# Patient Record
Sex: Male | Born: 1990 | Race: Black or African American | Hispanic: No | Marital: Single | Smoking: Former smoker
Health system: Southern US, Community
[De-identification: ages and names within clinical notes are randomized; demographics above are authoritative.]

---
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
## 2008-12-19 IMAGING — CR DG CLAVICLE*R*
1 series · 2 of 2 positions shown · non-contrast
Comparison: none

REASON FOR EXAM: injury football [REDACTED]
COMMENTS:   LMP: (Male)

PROCEDURE:     DXR - DXR CLAVICLE RIGHT  - November 12, 2006  [DATE]
RESULT:     No fracture or other significant osseous abnormality is
identified.

[Series 1: view not recorded · 0.17mm/px · 2 of 2 slices shown]
[im 1/2]
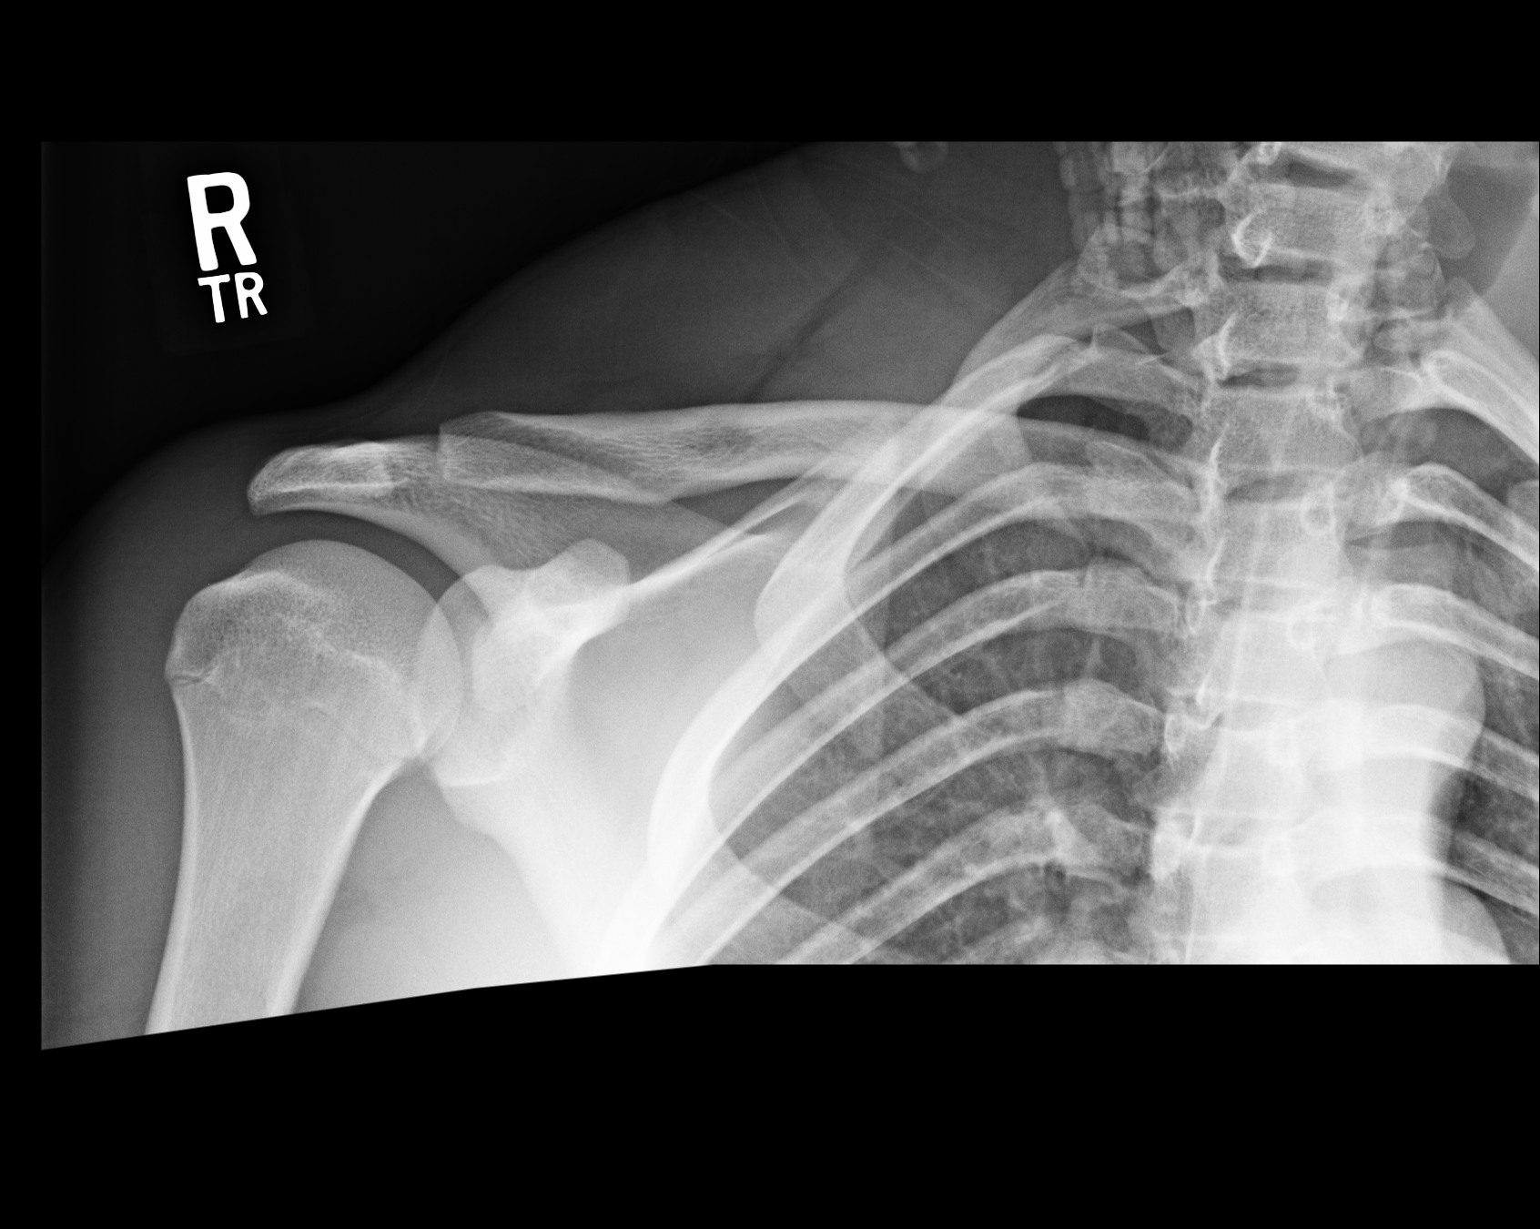
[im 2/2]
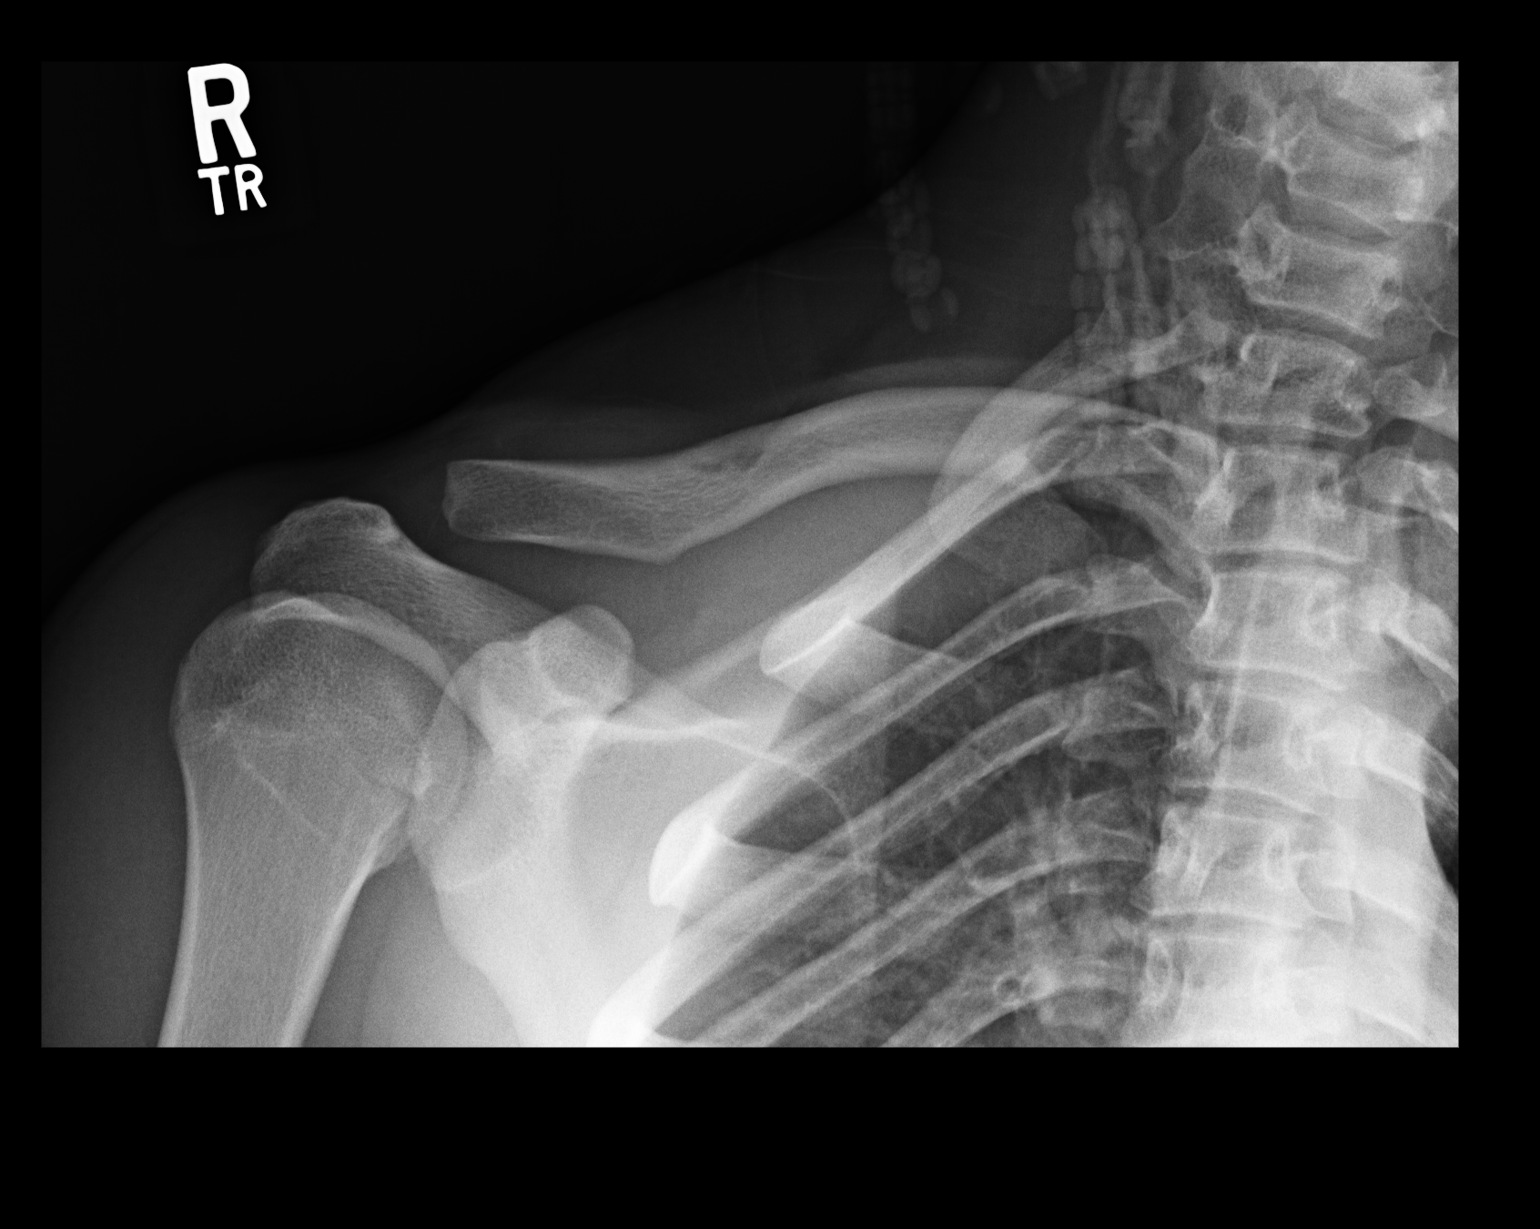

[2 of 2 positions shown; findings below may reference images not displayed]

IMPRESSION: 1.     No significant abnormalities are noted.

## 2009-06-12 ENCOUNTER — Emergency Department: Payer: Self-pay | Admitting: Emergency Medicine

## 2012-07-08 ENCOUNTER — Ambulatory Visit: Payer: No Typology Code available for payment source | Attending: Family Medicine | Admitting: Family Medicine

## 2012-07-08 DIAGNOSIS — Z23 Encounter for immunization: Secondary | ICD-10-CM | POA: Insufficient documentation

## 2012-07-08 DIAGNOSIS — S81809A Unspecified open wound, unspecified lower leg, initial encounter: Secondary | ICD-10-CM | POA: Insufficient documentation

## 2012-07-08 DIAGNOSIS — S91009A Unspecified open wound, unspecified ankle, initial encounter: Secondary | ICD-10-CM | POA: Insufficient documentation

## 2012-07-08 NOTE — Progress Notes (Signed)
Cc"  R knee laceration    Hx:  Scott Harrell is a 22 yo M on the [facility] who hit his knee on the sled today and had a stellate laceration just superior to his patella.  He reports it bled a lot when it happened.  He thinks he hit a rounded rusted button on the sled.  Is not certain of last tetanus shot.    Exam:  Gen- NAD  R knee- normal extensor mechanism and full strength to resisted knee extension without pain  Wound has a small about 1 cm stellate laceration about 1" superior to the patella. Some subQ fat exposed but otherwise superficial. No quad tendon exposed.     Procedure:    Surrounding skin cleaned with Chlorhexadine.  2 cc 1% Lidocaine with epi injected to wound edges for anesthesia.  Wound irrigated with 250 cc normal saline.  3 interrupted sutures with 3.0 monofilament placed plus one 4.0 Ethilon suture.   He tolerated the procedure well.  abx oint and sterile dressing placed.    - He should be limited for a couple days so he doesn't pull the wound open again, and then assessed as he does more.    -Tdap vaccine today also.    - 48 hours of Keflex 500 mg, one tab 4 times per day, total #8, to be cautious.   - wound re-check tomorrow      Caren Griffins, MD

## 2012-08-17 ENCOUNTER — Emergency Department: Payer: Self-pay | Admitting: Internal Medicine

## 2013-06-24 ENCOUNTER — Ambulatory Visit: Payer: No Typology Code available for payment source | Attending: Family Medicine | Admitting: Family Medicine

## 2013-06-24 ENCOUNTER — Encounter (HOSPITAL_BASED_OUTPATIENT_CLINIC_OR_DEPARTMENT_OTHER): Payer: Self-pay | Admitting: Family Medicine

## 2013-06-24 ENCOUNTER — Other Ambulatory Visit (HOSPITAL_BASED_OUTPATIENT_CLINIC_OR_DEPARTMENT_OTHER): Payer: Self-pay

## 2013-06-24 VITALS — BP 132/81 | HR 53 | Temp 98.0°F | Ht 75.0 in | Wt 315.0 lb

## 2013-06-24 DIAGNOSIS — R310 Gross hematuria: Secondary | ICD-10-CM | POA: Insufficient documentation

## 2013-06-24 LAB — URINALYSIS COMPLETE, URN
Bacteria, URN: NONE SEEN
Bilirubin (Qual), URN: NEGATIVE
Epith Cells_Renal/Trans,URN: NEGATIVE /HPF
Epith Cells_Squamous, URN: NEGATIVE /LPF
Ketones, URN: NEGATIVE mg/dL
Nitrite, URN: NEGATIVE
Specific Gravity, URN: 1.009 g/mL (ref 1.002–1.027)
pH, URN: 7 (ref 5.0–8.0)

## 2013-06-24 LAB — COMPREHENSIVE METABOLIC PANEL
ALT (GPT): 40 U/L (ref 10–64)
AST (GOT): 43 U/L — ABNORMAL HIGH (ref 9–38)
Albumin: 4.5 g/dL (ref 3.5–5.2)
Alkaline Phosphatase (Total): 50 U/L (ref 42–136)
Anion Gap: 6 (ref 4–12)
Bilirubin (Total): 0.5 mg/dL (ref 0.2–1.3)
Calcium: 9.3 mg/dL (ref 8.9–10.2)
Carbon Dioxide, Total: 30 mEq/L (ref 22–32)
Chloride: 101 mEq/L (ref 98–108)
Creatinine: 1.31 mg/dL — ABNORMAL HIGH (ref 0.51–1.18)
GFR, Calc, African American: >60 mL/min (ref >59)
GFR, Calc, European American: >60 mL/min (ref >59)
Glucose: 82 mg/dL (ref 62–125)
Potassium: 4.1 mEq/L (ref 3.6–5.2)
Protein (Total): 7.3 g/dL (ref 6.0–8.2)
Sodium: 137 mEq/L (ref 135–145)
Urea Nitrogen: 17 mg/dL (ref 8–21)

## 2013-06-24 LAB — CBC, DIFF
% Basophils: 1 %
% Eosinophils: 2 %
% Immature Granulocytes: 0 %
% Lymphocytes: 26 %
% Monocytes: 7 %
% Neutrophils: 64 %
Absolute Eosinophil Count: 0.1 10*3/uL (ref 0.00–0.50)
Absolute Lymphocyte Count: 1.41 10*3/uL (ref 1.00–4.80)
Basophils: 0.03 10*3/uL (ref 0.00–0.20)
Hematocrit: 46 % (ref 38–50)
Hemoglobin: 15.6 g/dL (ref 13.0–18.0)
Immature Granulocytes: 0.01 10*3/uL (ref 0.00–0.05)
MCH: 27.3 pg (ref 27.3–33.6)
MCHC: 33.8 g/dL (ref 32.2–36.5)
MCV: 81 fL (ref 81–98)
Monocytes: 0.38 10*3/uL (ref 0.00–0.80)
Neutrophils: 3.5 10*3/uL (ref 1.80–7.00)
Platelet Count: 233 10*3/uL (ref 150–400)
RBC: 5.71 mil/uL — ABNORMAL HIGH (ref 4.40–5.60)
RDW-CV: 12.8 % (ref 11.6–14.4)
WBC: 5.43 10*3/uL (ref 4.3–10.0)

## 2013-06-24 LAB — 1ST EXTRA GOLD TOP

## 2013-06-24 LAB — MYOGLOBIN: Myoglobin: 85 ng/mL (ref 17–106)

## 2013-06-24 LAB — CK, CREATINE KINASE, TOTAL ACTIVITY: Creatine Kinase Total Activity: 919 U/L — ABNORMAL HIGH (ref 62–325)

## 2013-06-24 LAB — MYOGLOBIN, URINE: Myoglobin, Urine: 1 mcg/L (ref 0–52)

## 2013-06-24 NOTE — Progress Notes (Signed)
Cc: hematuria     HPI:  Scott Harrell is a 23 year old male on the [place] who presents for evaluation of gross hematuria  Started yesterday after his workout: standard 30 min on the field workout; urine was a little red last night  Drank a lot of water  Not much hematuria this morning but then increased  Feels fine now; no complaints  Maybe a little soreness on his lower abs like from a workout; no suprapubic pain; no flank pain  No dysuria or frequency  No discharge  No F/C/S, no recent illness  Had a cold 3 wks ago  No N/V/D or abd pain  No h/o kidney stones    No Rx meds  Taking amino acids (has been on that for awhile); nothing new    Exam:  BP 132/81   Pulse 53   Temp(Src) 98 F (36.7 C)   Ht 6\' 3"  (1.905 m)   Wt 315 lb (142.883 kg)   BMI 39.37 kg/m2     SpO2 97%   Gen - WD/WN, NAD  HEENT - PERRL, EOMI, sclear anicteric, conj pink, OP clear  Neck - no LAD, no TM or nodules  CV - RRR no murmurs  Lung - CTAB  Abd - s/ND/NT, no HSM, no masses  Back- no CVAT  Ext - no edema, good distal pulses  Skin - no rashes      A/P:  (599.71) Hematuria, gross  (primary encounter diagnosis)  Plan: Tygh  COMPLETE URINALYSIS, URINE C&S, MYOGLOBIN,        URINE, CBC, DIFF, COMPREHENSIVE METABOLIC         PANEL, CREATINE KINASE TOTAL ACTIVITY,         MYOGLOBIN  - clinically looks good; will get labs as above; results and clinical course will direct further workup; may need CT tomorrow and urology consultation  - will follow closely      Nolon Nations, MD

## 2013-06-24 NOTE — Progress Notes (Signed)
Addendum:  Labs noted --  Blood, protein, glu in urine.  Cr 1.3.    Scott Harrell developed R sided pain this evening. Went to [facility] ER.  CT with contrast suggestive of R renal mass.  Making arrangements for transfer to [facility] urology service.    Nolon Nations, MD

## 2013-06-25 ENCOUNTER — Other Ambulatory Visit (HOSPITAL_COMMUNITY): Payer: Self-pay | Admitting: Surgery

## 2013-06-25 ENCOUNTER — Observation Stay
Admission: RE | Admit: 2013-06-25 | Discharge: 2013-06-25 | Disposition: A | Discharge status: Home / Self Care | Payer: PRIVATE HEALTH INSURANCE | Source: Other Acute Inpatient Hospital | Attending: Urology | Admitting: Urology

## 2013-06-25 ENCOUNTER — Observation Stay (HOSPITAL_BASED_OUTPATIENT_CLINIC_OR_DEPARTMENT_OTHER): Payer: PRIVATE HEALTH INSURANCE | Admitting: Urology

## 2013-06-25 ENCOUNTER — Other Ambulatory Visit (HOSPITAL_BASED_OUTPATIENT_CLINIC_OR_DEPARTMENT_OTHER): Payer: Self-pay | Admitting: Urology

## 2013-06-25 DIAGNOSIS — R1031 Right lower quadrant pain: Secondary | ICD-10-CM

## 2013-06-25 DIAGNOSIS — N289 Disorder of kidney and ureter, unspecified: Secondary | ICD-10-CM | POA: Insufficient documentation

## 2013-06-25 DIAGNOSIS — R310 Gross hematuria: Secondary | ICD-10-CM

## 2013-06-25 LAB — BASIC METABOLIC PANEL
Anion Gap: 4 (ref 4–12)
Calcium: 8.6 mg/dL — ABNORMAL LOW (ref 8.9–10.2)
Carbon Dioxide, Total: 28 mEq/L (ref 22–32)
Chloride: 107 mEq/L (ref 98–108)
Creatinine: 1.22 mg/dL — ABNORMAL HIGH (ref 0.51–1.18)
GFR, Calc, African American: >60 mL/min (ref >59)
GFR, Calc, European American: >60 mL/min (ref >59)
Glucose: 103 mg/dL (ref 62–125)
Potassium: 3.9 mEq/L (ref 3.6–5.2)
Sodium: 139 mEq/L (ref 135–145)
Urea Nitrogen: 17 mg/dL (ref 8–21)

## 2013-06-26 LAB — URINE C/S
Colony Count: <100
Culture: NO GROWTH

## 2013-06-26 LAB — R/O MRSA

## 2013-06-27 ENCOUNTER — Other Ambulatory Visit (HOSPITAL_BASED_OUTPATIENT_CLINIC_OR_DEPARTMENT_OTHER): Payer: Self-pay | Admitting: Urology

## 2013-06-27 DIAGNOSIS — N2889 Other specified disorders of kidney and ureter: Secondary | ICD-10-CM

## 2013-06-30 ENCOUNTER — Encounter (HOSPITAL_BASED_OUTPATIENT_CLINIC_OR_DEPARTMENT_OTHER): Payer: Self-pay | Admitting: Urology

## 2013-06-30 ENCOUNTER — Ambulatory Visit: Payer: PRIVATE HEALTH INSURANCE | Attending: Urology | Admitting: Urology

## 2013-06-30 VITALS — BP 159/89 | HR 48 | Ht 75.0 in | Wt 323.3 lb

## 2013-06-30 DIAGNOSIS — N2889 Other specified disorders of kidney and ureter: Secondary | ICD-10-CM

## 2013-06-30 DIAGNOSIS — N289 Disorder of kidney and ureter, unspecified: Secondary | ICD-10-CM | POA: Insufficient documentation

## 2013-06-30 LAB — PROTHROMBIN & PTT
Partial Thromboplastin Time: 30 s (ref 22–35)
Prothrombin INR: 1 (ref 0.8–1.3)
Prothrombin Time Patient: 13.1 s (ref 10.7–15.6)

## 2013-07-01 ENCOUNTER — Other Ambulatory Visit (HOSPITAL_BASED_OUTPATIENT_CLINIC_OR_DEPARTMENT_OTHER): Payer: Self-pay | Admitting: Urology

## 2013-07-01 ENCOUNTER — Ambulatory Visit
Admission: RE | Admit: 2013-07-01 | Discharge: 2013-07-01 | Disposition: A | Discharge status: Home / Self Care | Payer: PRIVATE HEALTH INSURANCE | Attending: Urology | Admitting: Urology

## 2013-07-01 ENCOUNTER — Ambulatory Visit (HOSPITAL_COMMUNITY): Payer: PRIVATE HEALTH INSURANCE | Admitting: Urology

## 2013-07-01 ENCOUNTER — Ambulatory Visit (HOSPITAL_BASED_OUTPATIENT_CLINIC_OR_DEPARTMENT_OTHER): Payer: PRIVATE HEALTH INSURANCE | Admitting: Radiology Med

## 2013-07-01 ENCOUNTER — Ambulatory Visit (HOSPITAL_BASED_OUTPATIENT_CLINIC_OR_DEPARTMENT_OTHER): Payer: PRIVATE HEALTH INSURANCE

## 2013-07-01 DIAGNOSIS — N289 Disorder of kidney and ureter, unspecified: Secondary | ICD-10-CM | POA: Insufficient documentation

## 2013-07-01 DIAGNOSIS — C649 Malignant neoplasm of unspecified kidney, except renal pelvis: Secondary | ICD-10-CM

## 2013-07-01 DIAGNOSIS — C641 Malignant neoplasm of right kidney, except renal pelvis: Secondary | ICD-10-CM

## 2013-07-01 NOTE — Progress Notes (Signed)
UROLOGY OUTPATIENT CONSULTATION:     CHIEF COMPLAINT  Right renal mass.    HISTORY OF PRESENT ILLNESS  Scott Harrell is a 23 year old male for evaluation and management of a newly identified right renal mass. This mass was found upon presentation with painless gross hematuria. Scott Harrell notes associated symptoms of hematuria and flank pain associated with passage of clots. Scott Harrell has no history of tobacco use disorder, no family history of kidney cancer or renal tumors, and no history of obesity. Scott Harrell does not have hypertension.    Past Medical Hx:    None    Past Surgical Hx:    Left and right repairs of lateral meniscus tears.    Family Hx:    No family history of genitourinary illnesses or malignancies.    Social Hx:   reports that Scott Harrell has never smoked. Scott Harrell has never used smokeless tobacco.    Active Meds:    Outpatient Prescriptions Marked as Taking for the 06/30/13 encounter (Office Visit) with Scott Harrell   Medication Sig Dispense Refill   . Ondansetron HCl (ZOFRAN OR)        . OXYCODONE HCL ER OR            Allergies:    Allergies as of 06/30/2013   . (No Known Allergies)       Review of Systems:   A 14-system review of systems was filled out by the patient and reviewed by me and can be linked to in the chart dated today.    OBJECTIVE:  Physical Exam  Vitals:  BP 159/89   Pulse 48   Ht 6\' 3"  (1.905 m)   Wt 323 lb 4.8 oz (146.648 kg)   BMI 40.41 kg/m2     General: Scott Harrell appears comfortable and in no acute distress.  HEENT:  WNL  Lymphatic:  No neck adenopathy, no groin adenopathy, no axilla adenopathy.  Respiratory:  Normal effort, bilateral symmetric chest expansion, no wheezes.  Cardiovascular:  Palpable pulses in the upper extremities that are regular.  Abdomen: No abdominal masses or tenderness, no hepatosplenomegaly  Back: , No CVA tenderness and no flank mass.  Extremities:  No clubbing or cyanosis  Neuro/Psych:  Alert and Oriented x3, affect  appropriate.    Labs Reviewed today with the patient include:  Results for orders placed in visit on 06/30/13   PROTHROMBIN & PTT       Result Value Ref Range    Prothrombin Time Patient 13.1  10.7 - 15.6 s    Prothrombin INR 1.0  0.8 - 1.3    PTT Patient 30  22 - 35 s    PTT X_Mean        Value: To calculate the PTT X Mean divide PTT value by 29.     IMAGING:  I personally reviewed his imaging including his CT and MRI. These demonstrate a normal left kidney with no masses, hydronephrosis, or stones. The right kidney is largely normal with a centrally located 3.2 cm mass. On CT, the mass is slightly hyperdense on precontrast images. On MRI, there is mild enhancement on the nephrographic phase but definite loss of contrast on the delayed phase. The mass overtly abuts or even invades the collecting system and has caused retraction of the renal capsule overlying the tumor. There are some sub-centimeter lymph nodes in the right renal hilum and right paracaval area.    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 The differential includes collecting duct carcinoma and renal medullary carcinoma in addition to [place].     PLAN:  1. I had a long discussion with Scott Harrell regarding the nature of renal tumors including the likelihood that this represents a renal cell carcinoma. We discussed the staging of kidney cancer and our impression that-if this is a cancer-this renal mass would be a clinical stage T1a cancer. We discussed options for management of this renal mass including active surveillance and intervention with either ablation or extirpation. We also discussed the option of renal mass biopsy and the rationale for proceeding with renal mass biopsy given our concerns for more aggressive histologies.  2. We discussed that, for surgery for a renal mass the first  consideration is whether or not the renal mass is amenable to nephron-sparing surgery. We discussed that the renal mass we are reviewing is potentiall amenable to nephron-sparing surgery such as with partial nephrectomy for the following reasons: size, his young age. We discussed that this surgery could be accomplished through minimally-invasive techniques such as with robot-assisted or laparoscopic techniques. We reviewed the surgical and technical risks as well as the medical and anesthetic risks. We completed the preoperative packet and informed consent document for a planned robotic renal surgery with the decision for partial versus radical nephrectomy pending biopsy results.   3. We discussed the risks, benefits, and alternatives to proceeding with renal mass biopsy. These include the risks of bleeding, infection, and non-diagnosis. We have submitted an order for renal mass biopsy today and this is scheduled for tomorrow. We will contact Scott Harrell to review the biopsy results.    Thank you very much for referring Scott Harrell to our clinic at the [facility].

## 2013-07-03 LAB — PATHOLOGY, SURGICAL

## 2013-07-09 ENCOUNTER — Other Ambulatory Visit (HOSPITAL_BASED_OUTPATIENT_CLINIC_OR_DEPARTMENT_OTHER): Payer: PRIVATE HEALTH INSURANCE | Admitting: Urology

## 2013-07-09 DIAGNOSIS — C649 Malignant neoplasm of unspecified kidney, except renal pelvis: Secondary | ICD-10-CM

## 2013-07-15 ENCOUNTER — Other Ambulatory Visit (HOSPITAL_BASED_OUTPATIENT_CLINIC_OR_DEPARTMENT_OTHER): Payer: Self-pay | Admitting: Radiology Med

## 2013-07-15 ENCOUNTER — Other Ambulatory Visit (HOSPITAL_BASED_OUTPATIENT_CLINIC_OR_DEPARTMENT_OTHER): Payer: Self-pay

## 2013-07-15 ENCOUNTER — Ambulatory Visit
Admit: 2013-07-15 | Discharge: 2013-07-15 | Disposition: A | Discharge status: Home / Self Care | Payer: PRIVATE HEALTH INSURANCE | Attending: Urology | Admitting: Urology

## 2013-07-15 DIAGNOSIS — N289 Disorder of kidney and ureter, unspecified: Secondary | ICD-10-CM | POA: Insufficient documentation

## 2013-07-15 DIAGNOSIS — C649 Malignant neoplasm of unspecified kidney, except renal pelvis: Secondary | ICD-10-CM | POA: Insufficient documentation

## 2013-07-15 LAB — CBC (HEMOGRAM)
Hematocrit: 45 % (ref 38–50)
Hemoglobin: 15.1 g/dL (ref 13.0–18.0)
MCH: 26.8 pg — ABNORMAL LOW (ref 27.3–33.6)
MCHC: 33.8 g/dL (ref 32.2–36.5)
MCV: 79 fL — ABNORMAL LOW (ref 81–98)
Platelet Count: 200 10*3/uL (ref 150–400)
RBC: 5.64 mil/uL — ABNORMAL HIGH (ref 4.40–5.60)
RDW-CV: 12.4 % (ref 11.6–14.4)
WBC: 5.46 10*3/uL (ref 4.3–10.0)

## 2013-07-15 LAB — PROTHROMBIN & PTT
Partial Thromboplastin Time: 30 s (ref 22–35)
Prothrombin INR: 1 (ref 0.8–1.3)
Prothrombin Time Patient: 13 s (ref 10.7–15.6)

## 2013-07-16 LAB — PSBC PREADMIT TYPE AND SCREEN: Indir. Antiglobulin Rslt (Sendout): NEGATIVE

## 2013-07-16 LAB — ABO AND RH CONFIRMATION (SENDOUT)

## 2013-07-17 ENCOUNTER — Inpatient Hospital Stay (HOSPITAL_COMMUNITY): Payer: PRIVATE HEALTH INSURANCE | Admitting: Urology

## 2013-07-17 ENCOUNTER — Other Ambulatory Visit (HOSPITAL_COMMUNITY): Payer: Self-pay | Admitting: Urology

## 2013-07-17 ENCOUNTER — Inpatient Hospital Stay
Admission: RE | Admit: 2013-07-17 | Discharge: 2013-07-18 | DRG: 657 | Disposition: A | Discharge status: Home / Self Care | Payer: PRIVATE HEALTH INSURANCE | Attending: Urology | Admitting: Urology

## 2013-07-17 ENCOUNTER — Other Ambulatory Visit (HOSPITAL_BASED_OUTPATIENT_CLINIC_OR_DEPARTMENT_OTHER): Payer: Self-pay | Admitting: Urology

## 2013-07-17 DIAGNOSIS — C649 Malignant neoplasm of unspecified kidney, except renal pelvis: Secondary | ICD-10-CM

## 2013-07-17 DIAGNOSIS — C772 Secondary and unspecified malignant neoplasm of intra-abdominal lymph nodes: Secondary | ICD-10-CM

## 2013-07-17 DIAGNOSIS — Z6841 Body Mass Index (BMI) 40.0 and over, adult: Secondary | ICD-10-CM

## 2013-07-17 LAB — HEMATOCRIT: Hematocrit: 43 % (ref 38–50)

## 2013-07-18 ENCOUNTER — Other Ambulatory Visit (HOSPITAL_COMMUNITY): Payer: Self-pay | Admitting: Urology

## 2013-07-18 DIAGNOSIS — Z483 Aftercare following surgery for neoplasm: Secondary | ICD-10-CM

## 2013-07-18 DIAGNOSIS — Z48816 Encounter for surgical aftercare following surgery on the genitourinary system: Secondary | ICD-10-CM

## 2013-07-18 LAB — BASIC METABOLIC PANEL
Anion Gap: 8 (ref 4–12)
Calcium: 8.7 mg/dL — ABNORMAL LOW (ref 8.9–10.2)
Carbon Dioxide, Total: 25 mEq/L (ref 22–32)
Chloride: 102 mEq/L (ref 98–108)
Creatinine: 1.88 mg/dL — ABNORMAL HIGH (ref 0.51–1.18)
GFR, Calc, African American: 54 mL/min — ABNORMAL LOW (ref >59)
GFR, Calc, European American: 44 mL/min — ABNORMAL LOW (ref >59)
Glucose: 114 mg/dL (ref 62–125)
Potassium: 3.8 mEq/L (ref 3.6–5.2)
Sodium: 135 mEq/L (ref 135–145)
Urea Nitrogen: 18 mg/dL (ref 8–21)

## 2013-07-18 LAB — HEMATOCRIT: Hematocrit: 42 % (ref 38–50)

## 2013-07-21 ENCOUNTER — Telehealth (HOSPITAL_BASED_OUTPATIENT_CLINIC_OR_DEPARTMENT_OTHER): Payer: Self-pay

## 2013-07-21 NOTE — Telephone Encounter (Signed)
Patient hasn't had  BM since Thursday last week (5 days ago).  Patient is taking oxycodone 2 tabs every 6 hour.  Denies nausea.  Passing flatus.  I consulted with Dr. Simeon Craft who recommended docusate BID, senna and add dulcolax tomorrow if still no BM.  Wean oxycodone as able.  Patient's dad was satisfied with the plan, will discuss with patient.

## 2013-07-23 LAB — PATHOLOGY, SURGICAL

## 2013-07-24 ENCOUNTER — Telehealth (HOSPITAL_BASED_OUTPATIENT_CLINIC_OR_DEPARTMENT_OTHER): Payer: Self-pay | Admitting: Family Medicine

## 2013-07-24 DIAGNOSIS — G8918 Other acute postprocedural pain: Secondary | ICD-10-CM

## 2013-07-24 DIAGNOSIS — R110 Nausea: Secondary | ICD-10-CM

## 2013-07-24 MED ORDER — HYDROCODONE-ACETAMINOPHEN 5-325 MG OR TABS
1.0000 | ORAL_TABLET | Freq: Four times a day (QID) | ORAL | Status: AC | PRN
Start: 2013-07-24 — End: ?

## 2013-07-24 MED ORDER — ONDANSETRON 4 MG OR TBDP
ORAL_TABLET | ORAL | Status: AC
Start: 2013-07-24 — End: ?

## 2013-07-24 NOTE — Telephone Encounter (Signed)
Post-op oxycodone is making him nauseous and requiring a lot of Zofran.    Will switch to Vicodin and refill Zofran.    Nolon Nations, MD

## 2013-07-31 ENCOUNTER — Encounter (HOSPITAL_BASED_OUTPATIENT_CLINIC_OR_DEPARTMENT_OTHER): Payer: Self-pay | Admitting: Urology

## 2013-07-31 ENCOUNTER — Ambulatory Visit: Payer: PRIVATE HEALTH INSURANCE | Attending: Urology | Admitting: Urology

## 2013-07-31 VITALS — BP 156/100 | HR 80 | Ht 75.0 in | Wt 329.7 lb

## 2013-07-31 DIAGNOSIS — C649 Malignant neoplasm of unspecified kidney, except renal pelvis: Secondary | ICD-10-CM | POA: Insufficient documentation

## 2013-07-31 NOTE — Progress Notes (Signed)
UROLOGY CLINIC NOTE      ID/CC:    Postoperative care following right robotic nephrectomy.    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 Scott Harrell has recovered well from surgery and reports good appetite, no constipation, and no pain requiring narcotic pain medications. Scott Harrell has had one of his port site incisions open up and is packing it, but otherwise has no concerns about the incisions including no redness, no swelling, and no discharge. They report that their energy level is improving. He is eager to resume strenuous activity.    Active Meds:    Outpatient Prescriptions Marked as Taking for the 07/31/13 encounter (Office Visit) with Scott Harrell   Medication Sig Dispense Refill   . Ondansetron 4 MG Oral TABLET DISPERSIBLE 1-2 tabs sublingual every 8 hours as needed 30 tablet 1   . Ondansetron HCl (ZOFRAN OR)          Allergies:    Allergies as of 07/31/2013   . (No Known Allergies)       OBJECTIVE:  Vital Signs:  BP 156/100  Pulse 80  Ht 6\' 3"  (1.905 m)  Wt 329 lb 11.2 oz (149.551 kg)  BMI 41.21 kg/m2  General: Scott Harrell appears comfortable and in no acute distress.  Abdomen: Soft, non-tender, non-distended, the incisions are clean, dry, and intact with the exception of his RLQ 8 mm port site that opened up. There is good granulation tissue at the base with no discharge and no adjacent cellulitis.    Review of Medical Images, tracings or specimens:    Labs Reviewed today with the patient include  Orders Only on 07/18/2013   Component Date Value Ref Range Status   . Sodium 07/18/2013 135  135 - 145 mEq/L Final   . Potassium 07/18/2013 3.8  3.6 - 5.2 mEq/L Final   . Chloride 07/18/2013 102  98 - 108 mEq/L Final   . Carbon Dioxide, Total 07/18/2013 25  22 - 32 mEq/L Final   . Anion Gap 07/18/2013 8  4 - 12  Final   . Glucose 07/18/2013 114  62 - 125 mg/dL Final   . Urea Nitrogen 07/18/2013 18  8 - 21 mg/dL Final   . Creatinine 07/18/2013 1.88* 0.51 - 1.18 mg/dL Final   . Calcium 07/18/2013 8.7* 8.9 - 10.2 mg/dL Final   . Gfr, Calc, European American 07/18/2013 44* >59 mL/min Final   . GFR, Calc, African American 07/18/2013 54* >59 mL/min Final   . GFR, Information 07/18/2013 Calculated GFR in mL/min/1.73 m2 by MDRD equation.  Inaccurate with changing renal function.  See http://depts.[email or web address]   Final       Pathology  I reviewed the pathology with Scott Harrell which demonstrated a 3.2 cm papillary type 2 RCC with positive hilar node.    Assessment  Scott Harrell is a 23 year old male doing well status-post right robotic nephrectomy.    Plan  I will see Scott Harrell back in 3 weeks for wound check. With respect to kidney cancer surveillance, we will perform his first CT in 3 months including chest, abdomen, and pelvis.

## 2013-08-21 ENCOUNTER — Encounter (HOSPITAL_BASED_OUTPATIENT_CLINIC_OR_DEPARTMENT_OTHER): Payer: PRIVATE HEALTH INSURANCE | Admitting: Urology

## 2013-08-28 ENCOUNTER — Ambulatory Visit: Payer: PRIVATE HEALTH INSURANCE | Attending: Urology | Admitting: Urology

## 2013-08-28 ENCOUNTER — Encounter (HOSPITAL_BASED_OUTPATIENT_CLINIC_OR_DEPARTMENT_OTHER): Payer: Self-pay | Admitting: Urology

## 2013-08-28 VITALS — BP 136/83 | HR 74 | Ht 75.0 in | Wt 333.0 lb

## 2013-08-28 DIAGNOSIS — C649 Malignant neoplasm of unspecified kidney, except renal pelvis: Secondary | ICD-10-CM | POA: Insufficient documentation

## 2013-08-29 NOTE — Progress Notes (Signed)
UROLOGY CLINIC NOTE      ID/CC:    Postoperative care following right robotic nephrectomy.    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 Scott Harrell has recovered well from surgery and reports good appetite, no constipation, and no pain requiring narcotic pain medications. Scott Harrell had one of his port site incisions open up and was packing it, but it has now completely healed. He is eager to resume strenuous activity.    Active Meds:    No outpatient prescriptions have been marked as taking for the 08/28/13 encounter (Office Visit) with Lauree Chandler.       Allergies:    Allergies as of 08/28/2013   . (No Known Allergies)       OBJECTIVE:  Vital Signs:  BP 136/83  Pulse 74  Ht 6\' 3"  (1.905 m)  Wt 333 lb (151.048 kg)  BMI 41.62 kg/m2  General: Scott Harrell appears comfortable and in no acute distress.  Abdomen: Soft, non-tender, non-distended, the incisions are clean, dry, and intact with excellent healing of the left arm robotic 8 mm port site that opened up.    Review of Medical Images, tracings or specimens:    Labs Reviewed today with the patient include  Orders Only on 07/18/2013   Component Date Value Ref Range Status   . Sodium 07/18/2013 135  135 - 145 mEq/L Final   . Potassium 07/18/2013 3.8  3.6 - 5.2 mEq/L Final   . Chloride 07/18/2013 102  98 - 108 mEq/L Final   . Carbon Dioxide, Total 07/18/2013 25  22 - 32 mEq/L Final   . Anion Gap 07/18/2013 8  4 - 12 Final   . Glucose 07/18/2013 114  62 - 125 mg/dL Final   . Urea Nitrogen 07/18/2013 18  8 - 21 mg/dL Final   . Creatinine 07/18/2013 1.88* 0.51 - 1.18 mg/dL Final   . Calcium 07/18/2013 8.7* 8.9 - 10.2 mg/dL Final   . Gfr, Calc, European American 07/18/2013 44* >59 mL/min Final   . GFR, Calc, African American 07/18/2013 54* >59 mL/min Final   . GFR, Information  07/18/2013 Calculated GFR in mL/min/1.73 m2 by MDRD equation.  Inaccurate with changing renal function.  See http://depts.[email or web address]   Final     Assessment  Scott Harrell is a 23 year old male doing well status-post right robotic nephrectomy.    Plan  I will see Scott Harrell back in September 2015 for surveillance CT of the chest, abdomen, and pelvis. He is going to review his schedule and let me know a good date to schedule his imaging.

## 2013-10-08 ENCOUNTER — Ambulatory Visit: Payer: PRIVATE HEALTH INSURANCE | Attending: Urology

## 2013-10-08 ENCOUNTER — Other Ambulatory Visit (HOSPITAL_BASED_OUTPATIENT_CLINIC_OR_DEPARTMENT_OTHER): Payer: PRIVATE HEALTH INSURANCE

## 2013-10-08 DIAGNOSIS — C649 Malignant neoplasm of unspecified kidney, except renal pelvis: Secondary | ICD-10-CM | POA: Insufficient documentation

## 2013-10-08 LAB — CREATININE BY I_STAT (POC), ~~LOC~~: Creatinine (POC): 2.2 mg/dL — ABNORMAL HIGH (ref 0.51–1.18)

## 2013-10-09 ENCOUNTER — Other Ambulatory Visit (HOSPITAL_BASED_OUTPATIENT_CLINIC_OR_DEPARTMENT_OTHER): Payer: PRIVATE HEALTH INSURANCE

## 2013-10-14 ENCOUNTER — Other Ambulatory Visit (INDEPENDENT_AMBULATORY_CARE_PROVIDER_SITE_OTHER): Payer: Self-pay

## 2013-10-14 ENCOUNTER — Ambulatory Visit
Admit: 2013-10-14 | Discharge: 2013-10-14 | Disposition: A | Discharge status: Home / Self Care | Payer: PRIVATE HEALTH INSURANCE | Attending: Family Medicine | Admitting: Family Medicine

## 2013-10-14 ENCOUNTER — Other Ambulatory Visit (HOSPITAL_BASED_OUTPATIENT_CLINIC_OR_DEPARTMENT_OTHER): Payer: PRIVATE HEALTH INSURANCE | Admitting: Family Medicine

## 2013-10-14 DIAGNOSIS — R5383 Other fatigue: Secondary | ICD-10-CM | POA: Insufficient documentation

## 2013-10-14 DIAGNOSIS — R5381 Other malaise: Secondary | ICD-10-CM | POA: Insufficient documentation

## 2013-10-14 DIAGNOSIS — C649 Malignant neoplasm of unspecified kidney, except renal pelvis: Secondary | ICD-10-CM | POA: Insufficient documentation

## 2013-10-14 LAB — COMPREHENSIVE METABOLIC PANEL
ALT (GPT): 29 U/L (ref 10–64)
AST (GOT): 34 U/L (ref 9–38)
Albumin: 4.6 g/dL (ref 3.5–5.2)
Alkaline Phosphatase (Total): 63 U/L (ref 42–136)
Anion Gap: 5 (ref 4–12)
Bilirubin (Total): 0.6 mg/dL (ref 0.2–1.3)
Calcium: 9.5 mg/dL (ref 8.9–10.2)
Carbon Dioxide, Total: 28 mEq/L (ref 22–32)
Chloride: 103 mEq/L (ref 98–108)
Creatinine: 2 mg/dL — ABNORMAL HIGH (ref 0.51–1.18)
GFR, Calc, African American: 50 mL/min — ABNORMAL LOW (ref >59)
GFR, Calc, European American: 41 mL/min — ABNORMAL LOW (ref >59)
Glucose: 122 mg/dL (ref 62–125)
Potassium: 4.4 mEq/L (ref 3.6–5.2)
Protein (Total): 7.2 g/dL (ref 6.0–8.2)
Sodium: 136 mEq/L (ref 135–145)
Urea Nitrogen: 25 mg/dL — ABNORMAL HIGH (ref 8–21)

## 2013-10-14 LAB — URINALYSIS COMPLETE, URN
Bacteria, URN: NONE SEEN
Bilirubin (Qual), URN: NEGATIVE
Epith Cells_Renal/Trans,URN: NEGATIVE /HPF
Glucose Qual, URN: NEGATIVE mg/dL
Ketones, URN: NEGATIVE mg/dL
Leukocyte Esterase, URN: NEGATIVE
Nitrite, URN: NEGATIVE
Occult Blood, URN: NEGATIVE
RBC, URN: NEGATIVE /HPF
Specific Gravity, URN: 1.024 g/mL (ref 1.002–1.027)
WBC, URN: NEGATIVE /HPF

## 2013-10-14 LAB — CBC, DIFF
% Basophils: 0 %
% Eosinophils: 1 %
% Immature Granulocytes: 1 %
% Lymphocytes: 17 %
% Monocytes: 6 %
% Neutrophils: 75 %
% Nucleated RBC: 0 %
Absolute Eosinophil Count: 0.05 10*3/uL (ref 0.00–0.50)
Absolute Lymphocyte Count: 1.24 10*3/uL (ref 1.00–4.80)
Basophils: 0.02 10*3/uL (ref 0.00–0.20)
Hematocrit: 44 % (ref 38–50)
Hemoglobin: 14.9 g/dL (ref 13.0–18.0)
Immature Granulocytes: 0.04 10*3/uL (ref 0.00–0.05)
MCH: 27.4 pg (ref 27.3–33.6)
MCHC: 34.1 g/dL (ref 32.2–36.5)
MCV: 81 fL (ref 81–98)
Monocytes: 0.43 10*3/uL (ref 0.00–0.80)
Neutrophils: 5.72 10*3/uL (ref 1.80–7.00)
Nucleated RBC: 0 10*3/uL
Platelet Count: 233 10*3/uL (ref 150–400)
RBC: 5.43 mil/uL (ref 4.40–5.60)
RDW-CV: 12.8 % (ref 11.6–14.4)
WBC: 7.5 10*3/uL (ref 4.3–10.0)

## 2013-10-14 LAB — SED RATE: Erythrocyte Sedimentation Rate: 7 mm/hr (ref 0–15)

## 2013-10-14 LAB — PROTEIN/CREATININE RATIO, TIMED URINE
Creatinine/Unit, Urine: 298 mg/dL
Protein (Total), Urine: 45 mg/dL
Protein/Creatinine Ratio: 0.2 — ABNORMAL HIGH (ref <0.2)

## 2013-10-14 LAB — THYROID STIMULATING HORMONE: Thyroid Stimulating Hormone: 2.766 u[IU]/mL (ref 0.400–5.000)

## 2013-10-14 LAB — CALCIUM

## 2013-10-14 LAB — CK, CREATINE KINASE, TOTAL ACTIVITY: Creatine Kinase Total Activity: 722 U/L — ABNORMAL HIGH (ref 62–325)

## 2013-10-15 ENCOUNTER — Other Ambulatory Visit (HOSPITAL_BASED_OUTPATIENT_CLINIC_OR_DEPARTMENT_OTHER): Payer: Self-pay | Admitting: Family Medicine

## 2013-10-15 DIAGNOSIS — R5381 Other malaise: Secondary | ICD-10-CM

## 2013-10-15 DIAGNOSIS — R5383 Other fatigue: Secondary | ICD-10-CM

## 2013-10-15 LAB — IRON BINDING CAPACITY (W/IRON, TRANSFERRIN & TRANSF SAT)
Iron, SRM: 57 ug/dL (ref 31–171)
Total Iron Binding Capacity: 326 ug/dL (ref 250–460)
Transferrin Saturation: 17 % (ref 15–50)
Transferrin: 233 mg/dL (ref 180–329)

## 2013-10-15 LAB — FERRITIN: Ferritin: 123 ng/mL (ref 20–230)

## 2013-10-15 NOTE — Progress Notes (Signed)
Patient here for lab draw.

## 2013-10-16 ENCOUNTER — Other Ambulatory Visit (HOSPITAL_BASED_OUTPATIENT_CLINIC_OR_DEPARTMENT_OTHER): Payer: Self-pay | Admitting: Family Medicine

## 2013-10-16 DIAGNOSIS — C649 Malignant neoplasm of unspecified kidney, except renal pelvis: Secondary | ICD-10-CM

## 2013-11-29 ENCOUNTER — Telehealth (HOSPITAL_BASED_OUTPATIENT_CLINIC_OR_DEPARTMENT_OTHER): Payer: Self-pay | Admitting: Family Medicine

## 2013-11-29 DIAGNOSIS — Z23 Encounter for immunization: Secondary | ICD-10-CM

## 2013-11-29 NOTE — Telephone Encounter (Signed)
Randale would like the pertussis vaccine.  Will request Tdap.  Can get at [facility].    Nolon Nations, MD

## 2013-12-01 ENCOUNTER — Ambulatory Visit (INDEPENDENT_AMBULATORY_CARE_PROVIDER_SITE_OTHER): Payer: Self-pay

## 2021-03-24 ENCOUNTER — Encounter: Payer: Self-pay | Admitting: Family Medicine

## 2021-03-24 ENCOUNTER — Ambulatory Visit: Payer: Self-pay | Admitting: Family Medicine

## 2021-03-24 ENCOUNTER — Other Ambulatory Visit: Payer: Self-pay

## 2021-03-24 DIAGNOSIS — Z113 Encounter for screening for infections with a predominantly sexual mode of transmission: Secondary | ICD-10-CM

## 2021-03-24 LAB — HEPATITIS B SURFACE ANTIGEN: Hepatitis B Surface Ag: NONREACTIVE

## 2021-03-24 LAB — HM HIV SCREENING LAB: HM HIV Screening: NEGATIVE

## 2021-03-24 LAB — HM HEPATITIS C SCREENING LAB: HM Hepatitis Screen: NEGATIVE

## 2021-03-24 NOTE — Progress Notes (Signed)
[facility] Department STI clinic/screening visit  Subjective:  Adam Moran is a 31 y.o. male being seen today for an STI screening visit. The patient reports they do not have symptoms.    Patient has the following medical conditions:  There are no problems to display for this patient.    Chief Complaint  Patient presents with   SEXUALLY TRANSMITTED DISEASE    Screening    HPI  Patient reports here for "annual screening" denies s/sx   Does the patient or their partner desires a pregnancy in the next year? Yes pt does but partner does not.   Screening for MPX risk: Does the patient have an unexplained rash? No Is the patient MSM? No Does the patient endorse multiple sex partners or anonymous sex partners? No Did the patient have close or sexual contact with a person diagnosed with MPX? No Has the patient traveled outside the [place] where MPX is endemic? No Is there a high clinical suspicion for MPX-- evidenced by one of the following No  -Unlikely to be chickenpox  -Lymphadenopathy  -Rash that present in same phase of evolution on any given body part   See flowsheet for further details and programmatic requirements.    The following portions of the patient's history were reviewed and updated as appropriate: allergies, current medications, past medical history, past social history, past surgical history and problem list.  Objective:  There were no vitals filed for this visit.  Physical Exam Constitutional:      Appearance: Normal appearance.  HENT:     Head: Normocephalic.     Mouth/Throat:     Mouth: Mucous membranes are moist.     Pharynx: Oropharynx is clear. No oropharyngeal exudate.  Pulmonary:     Effort: Pulmonary effort is normal.  Genitourinary:    Comments: Deferred, urine sample collected  Musculoskeletal:     Cervical back: Normal range of motion.  Lymphadenopathy:     Cervical: No cervical adenopathy.  Skin:    General: Skin is warm and  dry.     Findings: No bruising, erythema, lesion or rash.  Neurological:     Mental Status: He is alert and oriented to person, place, and time.  Psychiatric:        Mood and Affect: Mood normal.        Behavior: Behavior normal.      Assessment and Plan:  Adam Moran is a 31 y.o. male presenting to the [facility] Department for STI screening  1. Screening examination for venereal disease Patient does not have STI symptoms Patient accepted all screenings including  oral, urine, CT/GC and bloodwork for HIV/RPR.  Patient meets criteria for HepB screening? Yes. Ordered? Yes Patient meets criteria for HepC screening? Yes. Ordered? Yes Recommended condom use with all sex Discussed importance of condom use for STI prevent  Discussed time line for State Lab results and that patient will be called with positive results and encouraged patient to call if he had not heard in 2 weeks Recommended returning for continued or worsening symptoms.   - Chlamydia/Gonorrhea [place] [facility] - Chlamydia/Gonorrhea [place] [facility] - HBV Antigen/Antibody State Lab - HIV/HCV [place] [facility] - Syphilis Serology,  Lab     Return for as needed.  No future appointments.  Wendi Snipes, FNP

## 2021-04-04 ENCOUNTER — Telehealth: Payer: Self-pay

## 2021-04-04 NOTE — Telephone Encounter (Signed)
Phone call to pt at [phone]. Pt confirmed password from last visit. Counseled regarding positive chlamydia result.  Pt states he is currently in [place].  He will call us back once he is back in [place] and can speak with his employer about getting off work to come in for tx.

## 2021-04-04 NOTE — Telephone Encounter (Signed)
Calling pt regarding positive chlamydia result from 03/24/21 urine specimen. Pt needs tx appt.

## 2021-04-05 ENCOUNTER — Other Ambulatory Visit: Payer: Self-pay

## 2021-04-05 ENCOUNTER — Ambulatory Visit: Payer: Self-pay

## 2021-04-05 DIAGNOSIS — A749 Chlamydial infection, unspecified: Secondary | ICD-10-CM

## 2021-04-05 MED ORDER — DOXYCYCLINE HYCLATE 100 MG PO TABS: 100.0000 mg | ORAL_TABLET | Freq: Two times a day (BID) | ORAL | 0 refills | Status: AC

## 2021-04-05 NOTE — Progress Notes (Signed)
In Nurse Clinic for chlamydia treatment. Treated today per SO Dr Lubertha Sayres with doxycycline 100 mg #14 with instructions to take one capsule twice daily for 7 days. RN dispensed Doxycycline 100 mg #14 and instructions explained to pt. Advised to take med with food and to contact [facility] if vomits within 2 hrs of taking me. Questions answered and reports understanding. Josie Saunders, RN

## 2021-04-07 NOTE — Telephone Encounter (Signed)
Pt into clinic and treated on 04/05/21.

## 2022-02-02 ENCOUNTER — Ambulatory Visit: Payer: Self-pay | Admitting: Physician Assistant

## 2022-02-02 DIAGNOSIS — Z113 Encounter for screening for infections with a predominantly sexual mode of transmission: Secondary | ICD-10-CM

## 2022-02-02 LAB — HM HIV SCREENING LAB: HM HIV Screening: NEGATIVE

## 2022-02-02 NOTE — Progress Notes (Signed)
[facility] Department STI clinic/screening visit  Subjective:  Adam Moran is a 31 y.o. male being seen today for an STI screening visit. The patient reports they do not have symptoms.    Patient has the following medical conditions:  There are no problems to display for this patient.    Chief Complaint  Patient presents with   SEXUALLY TRANSMITTED DISEASE    Screening- patient not having any symptoms     Man here for STI screening. His male partner wants reassurance. He is asymptomatic and has not had other recent partners.    Last HIV test per patient/review of record was  Lab Results  Component Value Date   HMHIVSCREEN Negative - Validated 03/24/2021   No results found for: "HIV"  Does the patient or their partner desires a pregnancy in the next year? No  Screening for MPX risk: Does the patient have an unexplained rash? No Is the patient MSM? No Does the patient endorse multiple sex partners or anonymous sex partners? No Did the patient have close or sexual contact with a person diagnosed with MPX? No Has the patient traveled outside the [place] where MPX is endemic? No Is there a high clinical suspicion for MPX-- evidenced by one of the following No  -Unlikely to be chickenpox  -Lymphadenopathy  -Rash that present in same phase of evolution on any given body part   See flowsheet for further details and programmatic requirements.    There is no immunization history on file for this patient.   The following portions of the patient's history were reviewed and updated as appropriate: allergies, current medications, past medical history, past social history, past surgical history and problem list.  Objective:  There were no vitals filed for this visit.  Physical Exam Constitutional:      Appearance: Normal appearance. He is normal weight.  HENT:     Mouth/Throat:     Pharynx: Oropharynx is clear. No oropharyngeal exudate.     Comments: Dental  repairs noted, some caries, missing tooth Pulmonary:     Effort: Pulmonary effort is normal.  Abdominal:     Palpations: Abdomen is soft.     Tenderness: There is no abdominal tenderness.  Genitourinary:    Penis: Normal and circumcised. No erythema, tenderness or discharge.      Testes: Normal.     Epididymis:     Right: No tenderness.     Left: No tenderness.  Musculoskeletal:        General: No swelling or deformity.     Right lower leg: No edema.     Left lower leg: No edema.  Lymphadenopathy:     Cervical: No cervical adenopathy.     Lower Body: No right inguinal adenopathy. No left inguinal adenopathy.  Skin:    General: Skin is warm and dry.     Comments: Multiple tattoos noted on chest and arms  Neurological:     General: No focal deficit present.     Mental Status: He is alert and oriented to person, place, and time.  Psychiatric:        Behavior: Behavior normal.        Thought Content: Thought content normal.        Judgment: Judgment normal.       Assessment and Plan:  [name] is a 31 y.o. male presenting to the [facility] Department for STI screening  1. Screening examination for venereal disease Normal physical exam. Await test results.  Recommend pt keep dental appt as sched 02/2022. - HIV [place] [facility] - Syphilis Serology, [place] [facility] - Chlamydia/GC NAA, Confirmation   Patient does not have STI symptoms Patient accepted all screenings including   Patient meets criteria for HepB screening? Yes. Ordered? declined Patient meets criteria for HepC screening? Yes. Ordered? declined Recommended condom use with all sex Discussed importance of condom use for STI prevent  Discussed time line for State Lab results and that patient will be called with positive results and encouraged patient to call if he had not heard in 2 weeks   Return in about 6 months (around 08/04/2022) for STI screening.  No future appointments.  Landry Dyke, PA-C

## 2022-02-06 LAB — CHLAMYDIA/GC NAA, CONFIRMATION
Chlamydia trachomatis, NAA: NEGATIVE
Neisseria gonorrhoeae, NAA: NEGATIVE

## 2022-09-27 LAB — CYTOPATHOLOGY NON-GYN

## 2023-04-06 ENCOUNTER — Ambulatory Visit: Payer: Self-pay | Admitting: Nurse Practitioner

## 2023-04-06 ENCOUNTER — Encounter: Payer: Self-pay | Admitting: Nurse Practitioner

## 2023-04-06 ENCOUNTER — Ambulatory Visit: Payer: Self-pay

## 2023-04-06 DIAGNOSIS — Z113 Encounter for screening for infections with a predominantly sexual mode of transmission: Secondary | ICD-10-CM

## 2023-04-06 LAB — HM HIV SCREENING LAB: HM HIV Screening: NEGATIVE

## 2023-04-06 NOTE — Progress Notes (Signed)
Pt is here for STD screening. Patient declined condoms and given the opportunity to ask questions for clarifications. Sonda Primes, RN.

## 2023-04-06 NOTE — Progress Notes (Signed)
[facility] Department STI clinic [address] [place] [address] Main phone: [phone]  STI screening visit  Subjective:  Adam Moran is a 33 y.o. male being seen today for an STI screening visit. The patient reports they do not have symptoms.    Patient has the following medical conditions:  There are no active problems to display for this patient.   Chief Complaint  Patient presents with   SEXUALLY TRANSMITTED DISEASE    Pt is here For STD screening and has no symptoms    HPI HPI Patient reports no symptoms.  STI screening history: Last HIV test per patient/review of record was  Lab Results  Component Value Date   HMHIVSCREEN Negative - Validated 02/02/2022   No results found for: "HIV"  Last HEPC test per patient/review of record was  Lab Results  Component Value Date   HMHEPCSCREEN Negative-Validated 03/24/2021   No components found for: "HEPC"   Last HEPB test per patient/review of record was No components found for: "HMHEPBSCREEN"   Fertility: Does the patient or their partner desires a pregnancy in the next year? Yes  Screening for MPX risk: Does the patient have an unexplained rash? No Is the patient MSM? No Does the patient endorse multiple sex partners or anonymous sex partners? No Did the patient have close or sexual contact with a person diagnosed with MPX? No Has the patient traveled outside the [place] where MPX is endemic? No Is there a high clinical suspicion for MPX-- evidenced by one of the following No  -Unlikely to be chickenpox  -Lymphadenopathy  -Rash that present in same phase of evolution on any given body part   See flowsheet for further details and programmatic requirements.    There is no immunization history on file for this patient.   The following portions of the patient's history were reviewed and updated as appropriate: allergies, current medications, past medical history, past social  history, past surgical history and problem list.  Objective:  There were no vitals filed for this visit.  Physical Exam Vitals and nursing note reviewed.  Constitutional:      Appearance: Normal appearance.  HENT:     Head: Normocephalic and atraumatic.     Mouth/Throat:     Mouth: Mucous membranes are moist.     Pharynx: No oropharyngeal exudate or posterior oropharyngeal erythema.  Eyes:     General:        Right eye: No discharge.        Left eye: No discharge.     Conjunctiva/sclera:     Right eye: Right conjunctiva is not injected. No exudate.    Left eye: Left conjunctiva is not injected. No exudate. Pulmonary:     Effort: Pulmonary effort is normal.  Abdominal:     General: Abdomen is flat.     Palpations: Abdomen is soft. There is no hepatomegaly or mass.     Tenderness: There is no abdominal tenderness. There is no rebound.  Genitourinary:    Comments: Declined genital exam- asymptomatic Lymphadenopathy:     Cervical: No cervical adenopathy.     Upper Body:     Right upper body: No supraclavicular or axillary adenopathy.     Left upper body: No supraclavicular or axillary adenopathy.  Skin:    General: Skin is warm and dry.     Findings: No rash.  Neurological:     Mental Status: He is alert and oriented to person, place, and time.  Psychiatric:        Mood and Affect: Mood normal.        Behavior: Behavior normal.     Assessment and Plan:  Adam Moran is a 33 y.o. male presenting to the [facility] Department for STI screening  1. Screening for venereal disease (Primary)  - Syphilis Serology, [place] [facility] - HIV [place] [facility] - Chlamydia/Gonorrhea [place] [facility]   Patient does not have STI symptoms Patient accepted all screenings including  urine GC/Chlamydia, and blood work for HIV/Syphilis. Patient meets criteria for HepB screening? No. Ordered? no Patient meets criteria for HepC screening? No. Ordered? no Recommended condom use with  all sex Discussed importance of condom use for STI prevention  Treat positive test results per standing order. Discussed time line for State Lab results and that patient will be called with positive results and encouraged patient to call if he had not heard in 2 weeks Recommended repeat testing in 3 months with positive results. Recommended returning for continued or worsening symptoms.   No follow-ups on file.  No future appointments.  Alicia Amel, NP

## 2023-04-10 LAB — CHLAMYDIA/GC NAA, CONFIRMATION
Chlamydia trachomatis, NAA: NEGATIVE
Neisseria gonorrhoeae, NAA: NEGATIVE

## 2023-05-28 NOTE — Addendum Note (Signed)
 Addended by: Heywood Bene on: 05/28/2023 11:22 AM   Modules accepted: Orders

## 2024-03-06 ENCOUNTER — Ambulatory Visit

## 2024-03-06 DIAGNOSIS — Z113 Encounter for screening for infections with a predominantly sexual mode of transmission: Secondary | ICD-10-CM

## 2024-03-06 LAB — HM HIV SCREENING LAB: HM HIV Screening: NEGATIVE

## 2024-03-06 NOTE — Progress Notes (Signed)
 " [facility] Department STI clinic [address] [place] [address] Main phone: [phone]  STI screening visit  Subjective:  Adam Moran is a 34 y.o. male being seen today for an STI screening visit. The patient reports they do not have symptoms.    Patient has the following medical conditions:  There are no active problems to display for this patient.  Chief Complaint  Patient presents with   SEXUALLY TRANSMITTED DISEASE    HPI Patient reports desire for STI testing. No symptoms or contacts.  Reproductive considerations: Does the patient or their partner desires a pregnancy in the next year? No  See flowsheet for further details and programmatic requirements  Hyperlink available at the top of the signed note in blue.  Flow sheet content below:  Pregnancy Intention Screening Does the patient want to become pregnant in the next year?: N/A Does the patient's partner want to become pregnant in the next year?: No Would the patient like to discuss contraceptive options today?: N/A Reason For STD Screen STD Screening: Is asymptomatic Have you ever had an STD?: Yes History of Antibiotic use in the past 2 weeks?: No STD Symptoms Denies all: Yes Risk Factors for Hep B Household, sexual, or needle sharing contact of a person infected with Hep B: No Sexual contact with a person who uses drugs not as prescribed?: No Currently or Ever used drugs not as prescribed: No HIV Positive: No PRep Patient: No Men who have sex with men: No Have Hepatitis C: No History of Incarceration: No History of Homeslessness?: No Anal sex following anal drug use?: No Risk Factors for Hep C Currently using drugs not as prescribed: No Sexual partner(s) currently using drugs as not prescribed: No History of drug use: No HIV Positive: No People with a history of incarceration: No People born between the years of 100 and 59: No Counseling Patient  counseled to use condoms with all sex: Condoms declined RTC in 2-3 weeks for test results: Yes Clinic will call if test results abnormal before test result appt.: Yes Test results given to patient Patient counseled to use condoms with all sex: Condoms declined  Screening for MPX risk:  Unexplained rash?  No   MSM?  No   Multiple or anonymous sex partners?  No   Any close or sexual contact with a person  diagnosed with MPX?  No   Any outside the [place]  where MPX is endemic?  No   High clinical suspicion for MPX?    -Unlikely to be chickenpox    -Lymphadenopathy    -Rash that presents in same phase of       evolution on any given body part  No   Does this patient meet CDC recommendations for vaccination against MPOX? No  You already have or anticipate having the following risks:  Your sex partner has the following risks: You're traveling to a county with a clade I MPOX outbreak and anticipate these risks: Occupational exposure  You had known or suspected exposure to someone with monkeypox You had a sex partner in the past 2 weeks who was diagnosed with monkeypox You are a gay, bisexual, or other man who has sex with men, or are transgender or nonbinary and in the past 6 months have had any of the following: - A new diagnosis of one or more sexually transmitted diseases (e.g., chlamydia, gonorrhea, or syphilis) - More than one sex partner You have had any of the following  in the past 6 months: - Sex at a commercial sex venue (like a sex club or bathhouse) - Sex related to a large commercial event   or in a geographic area (city or county for example) where mpox virus transmission is occurring Sex with a new partner Sex at a commercial sex venue (e.g., a sex club or bathhouse) Sex in it consultant for money, goods, drugs, or other trade Sex in association with a large public event (e.g., a rave, party, or festival) i.e. certain people who work in a tax adviser or healthcare facility   Infectious  disease screenings: Vaccinated against HPV? No, declines  HIV Ever had a positive? No Last test: 2025 Results in chart:  Lab Results  Component Value Date   HMHIVSCREEN Negative - Validated 04/06/2023   No results found for: HIV   Hep B Hep B status: negative on 2023 Received HBV vaccination? Yes Received HBV testing for immunity? Yes Results in chart:  No components found for: HMHEPBSCREEN  Do they qualify for HBV screening today? No Criteria:  -Household, sexual or needle sharing contact with HBV -History of drug use or homelessness -HIV positive -Those with known Hep C  Hep C Hep C status: negative on 2023 Results in chart:  Lab Results  Component Value Date   HMHEPCSCREEN Negative-Validated 03/24/2021   No components found for: HEPC  Do they qualify for HCV screening today? No Criteria - since the last HCV result, does the patient have any of the following? - Current drug use - Have a partner with drug use - Has been incarcerated  Immunization history:   There is no immunization history on file for this patient.  The following portions of the patient's history were reviewed and updated as appropriate: allergies, current medications, past medical history, past social history, past surgical history and problem list.  Substance use screenings:  Uses tobacco products? No Uses vapes? No Uses alcohol? Yes, occasional Uses non-injectable substances that alter your mental status? No Uses non-prescribed injectable substances? No   There is no immunization history on file for this patient.  The following portions of the patient's history were reviewed and updated as appropriate: allergies, current medications, past medical history, past social history, past surgical history and problem list.  Objective:  There were no vitals filed for this visit.  Physical Exam Vitals and nursing note reviewed.  Constitutional:      Appearance: Normal appearance.  HENT:      Head: Normocephalic and atraumatic.     Comments: No nits or hair loss of scalp, brows, and lashes    Mouth/Throat:     Mouth: Mucous membranes are moist.     Pharynx: Oropharynx is clear. No oropharyngeal exudate or posterior oropharyngeal erythema.  Eyes:     General:        Right eye: No discharge.        Left eye: No discharge.     Conjunctiva/sclera: Conjunctivae normal.     Right eye: Right conjunctiva is not injected. No exudate.    Left eye: Left conjunctiva is not injected. No exudate. Pulmonary:     Effort: Pulmonary effort is normal.  Genitourinary:    Comments: Politely declined genital exam. Lymphadenopathy:     Cervical: No cervical adenopathy.     Upper Body:     Right upper body: No supraclavicular adenopathy.     Left upper body: No supraclavicular adenopathy.     Comments: Patient declines genital exam. Inguinal lymph nodes not assessed.  Skin:    General: Skin is warm and dry.     Findings: No lesion or rash.  Neurological:     Mental Status: He is alert and oriented to person, place, and time.    Assessment and Plan:  Adam Moran is a 34 y.o. male presenting to the [facility] Department for STI screening.  Patient accepted the following screenings: oral GC culture, urine CT/GC, HIV, and RPR  1. Screening examination for sexually transmitted disease (Primary)  - Gonococcus culture - Chlamydia/GC NAA, Confirmation - HIV [place] [facility] - Syphilis Serology, [place] [facility]   Counseling: Recommended condom use with all sex Discussed importance of condom use for STI prevention Discussed time line for State Lab results and that patient will be called with positive results and encouraged patient to call if they had not heard in 2 weeks Recommended repeat testing in 3 months with positive results. Recommended returning for continued or worsening symptoms.   Return for STI testing as needed.  No future appointments.  Damien FORBES Satchel,  NP "

## 2024-03-06 NOTE — Progress Notes (Signed)
 Pt is here STD screening. Condoms declined. Austine Lefort, RN.

## 2024-03-09 LAB — CHLAMYDIA/GC NAA, CONFIRMATION
Chlamydia trachomatis, NAA: NEGATIVE
Neisseria gonorrhoeae, NAA: NEGATIVE

## 2024-03-10 LAB — GONOCOCCUS CULTURE

## 9331-11-21 DEATH — deceased
